# Patient Record
Sex: Female | Born: 1970 | Race: Black or African American | Hispanic: No | Marital: Married | Smoking: Former smoker
Health system: Southern US, Community
[De-identification: ages and names within clinical notes are randomized; demographics above are authoritative.]

## PROBLEM LIST (undated history)

## (undated) DIAGNOSIS — D869 Sarcoidosis, unspecified: Secondary | ICD-10-CM

## (undated) HISTORY — PX: NASAL SINUS SURGERY: SHX719

---

## 2003-07-14 DIAGNOSIS — D869 Sarcoidosis, unspecified: Secondary | ICD-10-CM

## 2003-07-14 HISTORY — DX: Sarcoidosis, unspecified: D86.9

## 2005-03-13 ENCOUNTER — Emergency Department: Payer: Self-pay | Admitting: Emergency Medicine

## 2005-07-20 ENCOUNTER — Emergency Department: Payer: Self-pay | Admitting: Unknown Physician Specialty

## 2005-08-16 ENCOUNTER — Emergency Department: Payer: Self-pay | Admitting: Internal Medicine

## 2006-08-10 ENCOUNTER — Emergency Department: Payer: Self-pay | Admitting: Emergency Medicine

## 2006-09-03 ENCOUNTER — Emergency Department: Payer: Self-pay | Admitting: Emergency Medicine

## 2006-11-28 ENCOUNTER — Emergency Department: Payer: Self-pay | Admitting: Emergency Medicine

## 2007-01-06 ENCOUNTER — Ambulatory Visit: Payer: Self-pay | Admitting: Physician Assistant

## 2007-01-26 ENCOUNTER — Ambulatory Visit: Payer: Self-pay | Admitting: Pain Medicine

## 2007-02-03 ENCOUNTER — Ambulatory Visit: Payer: Self-pay | Admitting: Pain Medicine

## 2007-02-23 ENCOUNTER — Ambulatory Visit: Payer: Self-pay | Admitting: Physician Assistant

## 2007-03-17 ENCOUNTER — Ambulatory Visit: Payer: Self-pay | Admitting: Pain Medicine

## 2007-04-07 ENCOUNTER — Ambulatory Visit: Payer: Self-pay | Admitting: Pain Medicine

## 2007-09-28 ENCOUNTER — Emergency Department: Payer: Self-pay | Admitting: Emergency Medicine

## 2007-09-28 ENCOUNTER — Inpatient Hospital Stay (HOSPITAL_COMMUNITY): Admission: EM | Admit: 2007-09-28 | Discharge: 2007-09-29 | Payer: Self-pay | Admitting: Emergency Medicine

## 2007-10-06 ENCOUNTER — Encounter: Admission: RE | Admit: 2007-10-06 | Discharge: 2007-10-06 | Payer: Self-pay | Admitting: Neurosurgery

## 2007-10-27 ENCOUNTER — Ambulatory Visit: Payer: Self-pay | Admitting: Neurosurgery

## 2007-11-14 ENCOUNTER — Ambulatory Visit: Payer: Self-pay | Admitting: Neurosurgery

## 2007-12-27 ENCOUNTER — Ambulatory Visit: Payer: Self-pay | Admitting: Neurosurgery

## 2008-02-18 ENCOUNTER — Emergency Department: Payer: Self-pay | Admitting: Emergency Medicine

## 2009-04-16 ENCOUNTER — Emergency Department: Payer: Self-pay | Admitting: Internal Medicine

## 2009-11-11 ENCOUNTER — Emergency Department: Payer: Self-pay | Admitting: Emergency Medicine

## 2010-05-30 ENCOUNTER — Emergency Department: Payer: Self-pay | Admitting: Unknown Physician Specialty

## 2010-07-27 ENCOUNTER — Emergency Department: Payer: Self-pay | Admitting: Internal Medicine

## 2010-11-01 ENCOUNTER — Emergency Department: Payer: Self-pay | Admitting: Emergency Medicine
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
## 2011-03-28 ENCOUNTER — Emergency Department: Payer: Self-pay | Admitting: Emergency Medicine

## 2011-04-06 LAB — APTT: aPTT: 27

## 2011-04-06 LAB — CBC
MCHC: 34.1
RBC: 3.69 — ABNORMAL LOW
RDW: 13.2

## 2011-04-06 LAB — DIFFERENTIAL
Basophils Absolute: 0
Basophils Relative: 1
Neutro Abs: 7.5
Neutrophils Relative %: 83 — ABNORMAL HIGH

## 2011-04-06 LAB — BASIC METABOLIC PANEL
CO2: 26
Calcium: 8.5
Creatinine, Ser: 0.49
GFR calc Af Amer: >60
GFR calc non Af Amer: >60

## 2011-04-06 LAB — PROTIME-INR: INR: 1

## 2011-10-31 ENCOUNTER — Emergency Department: Payer: Self-pay | Admitting: Emergency Medicine

## 2011-10-31 LAB — CBC
MCH: 26.1 pg (ref 26.0–34.0)
WBC: 4.5 10*3/uL (ref 3.6–11.0)

## 2011-10-31 LAB — TROPONIN I
Troponin-I: <0.02 ng/mL
Troponin-I: <0.02 ng/mL

## 2011-10-31 LAB — BASIC METABOLIC PANEL
Anion Gap: 7 (ref 7–16)
BUN: 15 mg/dL (ref 7–18)
Chloride: 107 mmol/L (ref 98–107)
EGFR (African American): >60
EGFR (Non-African Amer.): >60

## 2011-10-31 LAB — PRO B NATRIURETIC PEPTIDE: B-Type Natriuretic Peptide: 112 pg/mL (ref 0–125)

## 2011-10-31 LAB — CK TOTAL AND CKMB (NOT AT ARMC): CK-MB: 1 ng/mL (ref 0.5–3.6)

## 2011-11-20 ENCOUNTER — Ambulatory Visit: Payer: Self-pay | Admitting: Internal Medicine

## 2011-11-20 LAB — HCG, QUANTITATIVE, PREGNANCY: Beta Hcg, Quant.: <1 m[IU]/mL — ABNORMAL LOW

## 2014-12-17 ENCOUNTER — Ambulatory Visit
Admission: EM | Admit: 2014-12-17 | Discharge: 2014-12-17 | Disposition: A | Discharge status: Home / Self Care | Payer: BLUE CROSS/BLUE SHIELD | Attending: Family Medicine | Admitting: Family Medicine

## 2014-12-17 DIAGNOSIS — J0100 Acute maxillary sinusitis, unspecified: Secondary | ICD-10-CM

## 2014-12-17 HISTORY — DX: Sarcoidosis, unspecified: D86.9

## 2014-12-17 MED ORDER — AMOXICILLIN-POT CLAVULANATE 875-125 MG PO TABS: 1.0000 | ORAL_TABLET | Freq: Two times a day (BID) | ORAL | Status: DC

## 2014-12-17 NOTE — ED Notes (Signed)
Pt states "ear pain, sinus pressure and fevers started Saturday. I always have allergy and sinus problems when the weather changes."

## 2014-12-17 NOTE — ED Provider Notes (Signed)
CSN: [id number]     Arrival date & time 12/17/14  1734 History   First MD Initiated Contact with Patient 12/17/14 1845     Chief Complaint  Patient presents with  . Facial Pain  . Otalgia  . Fever   (Consider location/radiation/quality/duration/timing/severity/associated sxs/prior Treatment) Patient is a 44 y.o. female presenting with ear pain and fever. The history is provided by the patient.  Otalgia Location:  Left Severity:  Mild Onset quality:  Sudden Ineffective treatments:  OTC medications Associated symptoms: congestion, cough, fever, rhinorrhea and sore throat   Associated symptoms: no diarrhea, no ear discharge and no vomiting   Associated symptoms comment:  Sinus pressure and sinus headaches Fever Associated symptoms: congestion, cough, ear pain, rhinorrhea and sore throat   Associated symptoms: no diarrhea and no vomiting     Past Medical History  Diagnosis Date  . Hypertension   . Sarcoidosis 2005   Past Surgical History  Procedure Laterality Date  . Nasal sinus surgery     No family history on file. History  Substance Use Topics  . Smoking status: Current Every Day Smoker    Types: Cigarettes  . Smokeless tobacco: Not on file  . Alcohol Use: No   OB History    No data available     Review of Systems  Constitutional: Positive for fever.  HENT: Positive for congestion, ear pain, rhinorrhea and sore throat. Negative for ear discharge.   Respiratory: Positive for cough.   Gastrointestinal: Negative for vomiting and diarrhea.    Allergies  Review of patient's allergies indicates no known allergies.  Home Medications   Prior to Admission medications   Medication Sig Start Date End Date Taking? Authorizing Provider  cetirizine (ZYRTEC) 10 MG tablet Take 10 mg by mouth daily.   Yes Historical Provider, MD  fluticasone (FLONASE) 50 MCG/ACT nasal spray Place into both nostrils daily.   Yes Historical Provider, MD  ibuprofen (ADVIL,MOTRIN) 200 MG tablet  Take 800 mg by mouth every 8 (eight) hours as needed.   Yes Historical Provider, MD  amoxicillin-clavulanate (AUGMENTIN) 875-125 MG per tablet Take 1 tablet by mouth 2 (two) times daily. 12/17/14   Amy Mccallumrlando Kyndal Gloster, MD   BP 127/88 mmHg  Pulse 81  Temp(Src) 99 F (37.2 C) (Tympanic)  Resp 16  Ht 5\' 1"  (1.549 m)  Wt 160 lb (72.576 kg)  BMI 30.25 kg/m2  SpO2 100%  LMP 12/08/2014 (Exact Date) Physical Exam  Constitutional: She appears well-developed and well-nourished. No distress.  HENT:  Head: Normocephalic and atraumatic.  Right Ear: Tympanic membrane, external ear and ear canal normal.  Left Ear: Tympanic membrane, external ear and ear canal normal.  Nose: Mucosal edema and rhinorrhea present. No nose lacerations, sinus tenderness, nasal deformity, septal deviation or nasal septal hematoma. No epistaxis.  No foreign bodies. Right sinus exhibits maxillary sinus tenderness and frontal sinus tenderness. Left sinus exhibits maxillary sinus tenderness and frontal sinus tenderness.  Mouth/Throat: Uvula is midline, oropharynx is clear and moist and mucous membranes are normal. No oropharyngeal exudate.  Eyes: Conjunctivae and EOM are normal. Pupils are equal, round, and reactive to light. Right eye exhibits no discharge. Left eye exhibits no discharge. No scleral icterus.  Neck: Normal range of motion. Neck supple. No thyromegaly present.  Cardiovascular: Normal rate, regular rhythm and normal heart sounds.   Pulmonary/Chest: Effort normal and breath sounds normal. No respiratory distress. She has no wheezes. She has no rales.  Lymphadenopathy:    She has no cervical adenopathy.  Neurological: She is alert.  Skin: No rash noted. She is not diaphoretic.  Nursing note and vitals reviewed.   ED Course  Procedures (including critical care time) Labs Review Labs Reviewed - No data to display  Imaging Review No results found.   MDM   1. Acute maxillary sinusitis, recurrence not specified     Discharge Medication List as of 12/17/2014  6:55 PM    START taking these medications   Details  amoxicillin-clavulanate (AUGMENTIN) 875-125 MG per tablet Take 1 tablet by mouth 2 (two) times daily., Starting 12/17/2014, Until Discontinued, Normal      Plan: 1. Diagnosis reviewed with patient 2. rx as per orders; risks, benefits, potential side effects reviewed with patient 3. Recommend supportive treatment with otc analgesics, rest, increased fluids 4. F/u prn if symptoms worsen or don't improve    Amy Mccallum, MD 12/17/14 [phone]

## 2016-08-05 ENCOUNTER — Encounter: Payer: Self-pay | Admitting: Emergency Medicine

## 2016-08-05 ENCOUNTER — Emergency Department
Admission: EM | Admit: 2016-08-05 | Discharge: 2016-08-05 | Disposition: A | Discharge status: Home / Self Care | Payer: BLUE CROSS/BLUE SHIELD | Attending: Emergency Medicine | Admitting: Emergency Medicine

## 2016-08-05 DIAGNOSIS — R509 Fever, unspecified: Secondary | ICD-10-CM | POA: Insufficient documentation

## 2016-08-05 DIAGNOSIS — Z79899 Other long term (current) drug therapy: Secondary | ICD-10-CM | POA: Insufficient documentation

## 2016-08-05 DIAGNOSIS — R51 Headache: Secondary | ICD-10-CM | POA: Insufficient documentation

## 2016-08-05 DIAGNOSIS — R05 Cough: Secondary | ICD-10-CM | POA: Insufficient documentation

## 2016-08-05 DIAGNOSIS — J111 Influenza due to unidentified influenza virus with other respiratory manifestations: Secondary | ICD-10-CM

## 2016-08-05 DIAGNOSIS — R69 Illness, unspecified: Secondary | ICD-10-CM

## 2016-08-05 DIAGNOSIS — R5383 Other fatigue: Secondary | ICD-10-CM | POA: Insufficient documentation

## 2016-08-05 DIAGNOSIS — I10 Essential (primary) hypertension: Secondary | ICD-10-CM | POA: Insufficient documentation

## 2016-08-05 DIAGNOSIS — F17210 Nicotine dependence, cigarettes, uncomplicated: Secondary | ICD-10-CM | POA: Insufficient documentation

## 2016-08-05 LAB — INFLUENZA PANEL BY PCR (TYPE A & B)
Influenza A By PCR: POSITIVE — AB
Influenza B By PCR: NEGATIVE

## 2016-08-05 MED ORDER — ACETAMINOPHEN 325 MG PO TABS
ORAL_TABLET | ORAL | Status: AC
  Filled 2016-08-05: qty 2

## 2016-08-05 MED ORDER — ACETAMINOPHEN 325 MG PO TABS
650.0000 mg | ORAL_TABLET | Freq: Once | ORAL | Status: AC | PRN
  Administered 2016-08-05: 650 mg via ORAL

## 2016-08-05 MED ORDER — ACETAMINOPHEN 500 MG PO TABS
1000.0000 mg | ORAL_TABLET | Freq: Once | ORAL | Status: DC
Start: 2016-08-05 — End: 2016-08-05
  Filled 2016-08-05: qty 2

## 2016-08-05 MED ORDER — OSELTAMIVIR PHOSPHATE 75 MG PO CAPS: 75.0000 mg | ORAL_CAPSULE | Freq: Two times a day (BID) | ORAL | 0 refills | Status: AC

## 2016-08-05 NOTE — ED Provider Notes (Signed)
[facility] Emergency Department Provider Note  ____________________________________________  Time seen: Approximately 5:44 PM  I have reviewed the triage vital signs and the nursing notes.   HISTORY  Chief Complaint Cough; Fever; and Generalized Body Aches    HPI Amy [name] is a 46 y.o. female presenting to the emergency department with headache, congestion, rhinorrhea, fatigue, myalgias and nonproductive cough that started yesterday. Patient has been febrile. Fever has been as high as 102.28F assessed orally. 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 She denies chest pain, chest tightness, shortness of breath, cough that disturbs sleep, diarrhea and vomiting. Patient is staying hydrated. She has experienced diminished appetite and increased sleep. She has tried Tylenol but has attempted no other alleviating measures. No recent travel.   Past Medical History:  Diagnosis Date  . Hypertension   . Sarcoidosis (HCC) 2005    There are no active problems to display for this patient.   Past Surgical History:  Procedure Laterality Date  . NASAL SINUS SURGERY      Prior to Admission medications   Medication Sig Start Date End Date Taking? Authorizing Provider  amoxicillin-clavulanate (AUGMENTIN) 875-125 MG per tablet Take 1 tablet by mouth 2 (two) times daily. 12/17/14   Payton Mccallumrlando Conty, MD  cetirizine (ZYRTEC) 10 MG tablet Take 10 mg by mouth daily.    Historical Provider, MD  fluticasone (FLONASE) 50 MCG/ACT nasal spray Place into both nostrils daily.    Historical Provider, MD  ibuprofen (ADVIL,MOTRIN) 200 MG tablet Take 800 mg by mouth every 8 (eight) hours as needed.    Historical Provider, MD  oseltamivir (TAMIFLU) 75 MG capsule Take 1 capsule (75 mg total) by mouth 2 (two) times daily. 08/05/16 08/10/16  Orvil FeilJaclyn M Woods, PA-C    Allergies Patient has no known allergies.  No family history on file.  Social History Social History   Substance Use Topics  . Smoking status: Current Every Day Smoker    Types: Cigarettes  . Smokeless tobacco: Never Used  . Alcohol use No    Review of Systems  Constitutional: Patient has had fever.  Eyes: No visual changes. No discharge ENT: Patient has had congestion.  Cardiovascular: no chest pain. Respiratory: Patient has had non-productive cough.  No SOB. Gastrointestinal: Patient has had nausea.  Genitourinary: Negative for dysuria. No hematuria Musculoskeletal: Patient has had myalgias. Skin: Negative for rash, abrasions, lacerations, ecchymosis. Neurological: Negative for headaches, focal weakness or numbness.  ____________________________________________   PHYSICAL EXAM:  VITAL SIGNS: ED Triage Vitals  Enc Vitals Group     BP 08/05/16 1659 120/70     Pulse Rate 08/05/16 1659 88     Resp 08/05/16 1659 18     Temp 08/05/16 1659 (!) 102.3 F (39.1 C)     Temp Source 08/05/16 1659 Oral     SpO2 08/05/16 1659 100 %     Weight 08/05/16 1700 165 lb (74.8 kg)     Height 08/05/16 1700 5\' 1"  (1.549 m)     Head Circumference --      Peak Flow --      Pain Score 08/05/16 1703 10     Pain Loc --      Pain Edu? --      Excl. in GC? --      Constitutional: Alert and oriented. Patient is lying supine in bed.  Eyes: Conjunctivae are normal. PERRL. EOMI. Head: Atraumatic. ENT:      Ears: Tympanic membranes are injected  bilaterally without evidence of effusion or purulent exudate. Bony landmarks are visualized bilaterally. No pain with palpation at the tragus.      Nose: Nasal turbinates are edematous and erythematous. Copious rhinorrhea visualized.      Mouth/Throat: Mucous membranes are moist. Posterior pharynx is mildly erythematous. No tonsillar hypertrophy or purulent exudate. Uvula is midline. Neck: Full range of motion. No pain is elicited with flexion at the neck. Hematological/Lymphatic/Immunilogical: No cervical lymphadenopathy. Cardiovascular: Normal rate,  regular rhythm. Normal S1 and S2.  Good peripheral circulation. Respiratory: Normal respiratory effort without tachypnea or retractions. Lungs CTAB. Good air entry to the bases with no decreased or absent breath sounds. Gastrointestinal: Bowel sounds 4 quadrants. Soft and nontender to palpation. No guarding or rigidity. No palpable masses. No distention. No CVA tenderness.  Skin:  Skin is warm, dry and intact. No rash noted. Psychiatric: Mood and affect are normal. Speech and behavior are normal. Patient exhibits appropriate insight and judgement.  ____________________________________________   LABS (all labs ordered are listed, but only abnormal results are displayed)  Labs Reviewed  INFLUENZA PANEL BY PCR (TYPE A & B)   ____________________________________________  EKG   ____________________________________________  RADIOLOGY   No results found.  ____________________________________________    PROCEDURES  Procedure(s) performed:    Procedures    Medications  acetaminophen (TYLENOL) tablet 1,000 mg (not administered)  acetaminophen (TYLENOL) tablet 650 mg (650 mg Oral Given 08/05/16 1706)     ____________________________________________   INITIAL IMPRESSION / ASSESSMENT AND PLAN / ED COURSE  Pertinent labs & imaging results that were available during my care of the patient were reviewed by me and considered in my medical decision making (see chart for details).  Review of the [place] CSRS was performed in accordance of the [facility] prior to dispensing any controlled drugs.    Assessment and plan: Influenza:  Patient presents to the emergency department with headache, congestion, rhinorrhea, fatigue, myalgias and nonproductive cough that started yesterday. Symptoms are consistent with influenza. Patient was given Tylenol in emergency department for fever. She was discharged with Tamiflu. Rest and hydration were encouraged. Patient was advised to follow-up with her  primary care provider in one week. Physical exam is reassuring at this time. All patient questions were answered.  ____________________________________________  FINAL CLINICAL IMPRESSION(S) / ED DIAGNOSES  Final diagnoses:  Influenza-like illness      NEW MEDICATIONS STARTED DURING THIS VISIT:  New Prescriptions   OSELTAMIVIR (TAMIFLU) 75 MG CAPSULE    Take 1 capsule (75 mg total) by mouth 2 (two) times daily.        This chart was dictated using voice recognition software/Dragon. Despite best efforts to proofread, errors can occur which can change the meaning. Any change was purely unintentional.    Orvil Feil, PA-C 08/05/16 1754    Rockne Menghini, MD 08/05/16 2355

## 2016-08-05 NOTE — ED Triage Notes (Signed)
Pt comes into the ED via POV c/o flu-like symptoms with cough, fever, body aches.  Patient states she feels as though there is a severe pressure behind her ears and constant headaches.  Patient in NAD at this time with even and unlabored respirations.  Patient able to ambulate with no difficulty to triage room.

## 2016-08-07 ENCOUNTER — Telehealth: Payer: Self-pay | Admitting: Emergency Medicine

## 2016-08-07 NOTE — Telephone Encounter (Signed)
Called patient to inform of flu result.  Says she is feeling some better.  Has a headache still.  I told her that could be from flu, but if she thought the headache from something else like a stroke or meningitis, she should return.  She is taking the tamiflu.

## 2018-02-26 ENCOUNTER — Encounter: Payer: Self-pay | Admitting: Emergency Medicine

## 2018-02-26 ENCOUNTER — Emergency Department
Admission: EM | Admit: 2018-02-26 | Discharge: 2018-02-26 | Disposition: A | Discharge status: Home / Self Care | Payer: BLUE CROSS/BLUE SHIELD | Attending: Student in an Organized Health Care Education/Training Program | Admitting: Student in an Organized Health Care Education/Training Program

## 2018-02-26 DIAGNOSIS — Z87891 Personal history of nicotine dependence: Secondary | ICD-10-CM | POA: Insufficient documentation

## 2018-02-26 DIAGNOSIS — M5441 Lumbago with sciatica, right side: Secondary | ICD-10-CM | POA: Insufficient documentation

## 2018-02-26 DIAGNOSIS — Z79899 Other long term (current) drug therapy: Secondary | ICD-10-CM | POA: Insufficient documentation

## 2018-02-26 MED ORDER — KETOROLAC TROMETHAMINE 30 MG/ML IJ SOLN
30.0000 mg | Freq: Once | INTRAMUSCULAR | Status: AC
  Administered 2018-02-26: 30 mg via INTRAMUSCULAR
  Filled 2018-02-26: qty 1

## 2018-02-26 MED ORDER — PREDNISONE 10 MG PO TABS: ORAL_TABLET | ORAL | 0 refills | Status: AC

## 2018-02-26 NOTE — ED Notes (Signed)
Pt states she applies heating pads when pain is present and takes 4 ibuprofen (200mg ) per day. Per states" when the pain is present, which is usually everyday I take  4 ibuprofen (200mg ). Pt states, she noticed  R/leg swelling and left ankle swelling.

## 2018-02-26 NOTE — ED Notes (Signed)

## 2018-02-26 NOTE — ED Provider Notes (Signed)
[facility] Emergency Department Provider Note  ____________________________________________   First MD Initiated Contact with Patient 02/26/18 1018     (approximate)  I have reviewed the triage vital signs and the nursing notes.   HISTORY  Chief Complaint Back Pain   HPI Lonie PeakCathy A Marchio is a 47 y.o. female presents to the emergency department with complaint of intermittent low back pain radiating over to the right.  Patient states she does have radiation into her right hip and sometimes all the way down to her toes.  She denies any incontinence of bowel or bladder or saddle anesthesias.  Patient currently is not taking any over-the-counter medication for her back pain.  Patient denies any UTI symptoms or history of kidney stone.  Currently she rates her pain as 7 out of 10.   Past Medical History:  Diagnosis Date  . Sarcoidosis 2005    There are no active problems to display for this patient.   Past Surgical History:  Procedure Laterality Date  . NASAL SINUS SURGERY      Prior to Admission medications   Medication Sig Start Date End Date Taking? Authorizing Provider  cetirizine (ZYRTEC) 10 MG tablet Take 10 mg by mouth daily.    [provider]  fluticasone (FLONASE) 50 MCG/ACT nasal spray Place into both nostrils daily.    [provider]  ibuprofen (ADVIL,MOTRIN) 200 MG tablet Take 800 mg by mouth every 8 (eight) hours as needed.    [provider]  predniSONE (DELTASONE) 10 MG tablet Take 6 tablets  today, on day 2 take 5 tablets, day 3 take 4 tablets, day 4 take 3 tablets, day 5 take  2 tablets and 1 tablet the last day 02/26/18   Tommi RumpsSummers, Rhonda L, PA-C    Allergies Patient has no known allergies.  No family history on file.  Social History Social History   Tobacco Use  . Smoking status: Former Smoker    Types: Cigarettes, E-cigarettes  . Smokeless tobacco: Never Used  . Tobacco comment: Patient vapes    Substance Use Topics  . Alcohol use: No  . Drug use: No    Review of Systems Constitutional: No fever/chills Cardiovascular: Denies chest pain. Respiratory: Denies shortness of breath. Genitourinary: Negative for dysuria. Musculoskeletal: Positive for low back pain with right leg radiculopathy. Skin: Negative for rash. Neurological: Negative for headaches, focal weakness. ___________________________________________   PHYSICAL EXAM:  VITAL SIGNS: ED Triage Vitals  Enc Vitals Group     BP 02/26/18 1001 (!) 142/80     Pulse Rate 02/26/18 1001 69     Resp 02/26/18 1001 18     Temp 02/26/18 1001 98.7 F (37.1 C)     Temp Source 02/26/18 1001 Oral     SpO2 02/26/18 1001 100 %     Weight 02/26/18 0958 165 lb (74.8 kg)     Height 02/26/18 0958 5\' 1"  (1.549 m)     Head Circumference --      Peak Flow --      Pain Score 02/26/18 0958 7     Pain Loc --      Pain Edu? --      Excl. in GC? --    Constitutional: Alert and oriented. Well appearing and in no acute distress. Eyes: Conjunctivae are normal.  Head: Atraumatic. Neck: No stridor.   Cardiovascular: Normal rate, regular rhythm. Grossly normal heart sounds.  Good peripheral circulation. Respiratory: Normal respiratory effort.  No retractions. Lungs CTAB.  Gastrointestinal: Soft and nontender. No distention.  No CVA tenderness. Musculoskeletal: On examination of the back there is no gross deformity noted however there is tenderness on palpation of the L5-S1 area and right SI joint and soft tissue surrounding that area.  Range of motion is slow and guarded secondary to discomfort.  Straight leg raises are negative.  Good muscle strength bilaterally.  Patient is able to ambulate without any assistance. Neurologic:  Normal speech and language. No gross focal neurologic deficits are appreciated.  Reflexes 2+ bilaterally infra patella tendon.  No gait instability. Skin:  Skin is warm, dry and intact.  No rashes present. Psychiatric:  Mood and affect are normal. Speech and behavior are normal.  ____________________________________________   LABS (all labs ordered are listed, but only abnormal results are displayed)  Labs Reviewed - No data to display  PROCEDURES  Procedure(s) performed: None  Procedures  Critical Care performed: No  ____________________________________________   INITIAL IMPRESSION / ASSESSMENT AND PLAN / ED COURSE  As part of my medical decision making, I reviewed the following data within the electronic MEDICAL RECORD NUMBER Notes from prior ED visits and [place] Controlled Substance Database  Patient presents with low back pain with symptoms of right lower leg radiculopathy.  Patient denies any recent injury.  She is currently not taking any over-the-counter medication.  Patient was given Toradol 30 mg IM while in the department.  Patient was noted to be ambulatory without any assistance.  Patient is to start redness and 60 mg taper over the next 6 days.  She is to follow-up with her PCP or [facility] if any continued problems.  She was told she could take Tylenol if needed with the prednisone and encouraged to use ice or heat to her back. ____________________________________________   FINAL CLINICAL IMPRESSION(S) / ED DIAGNOSES  Final diagnoses:  Acute right-sided low back pain with right-sided sciatica     ED Discharge Orders         Ordered    predniSONE (DELTASONE) 10 MG tablet     02/26/18 1037           Note:  This document was prepared using Dragon voice recognition software and may include unintentional dictation errors.    Tommi RumpsSummers, Rhonda L, PA-C 02/26/18 1455    Willy Eddyobinson, Patrick, MD 02/26/18 [phone]

## 2018-02-26 NOTE — Discharge Instructions (Addendum)
Call make an appointment with 1 the clinics listed on your discharge papers or Doctor of your choice to establish primary care.  Begin taking prednisone for the next 6 days as directed.  You are also given an injection of Toradol 30 mg IM.  You may also use ice or heat to your back as needed for discomfort.

## 2018-02-26 NOTE — ED Triage Notes (Signed)
Patient presents to the ED with right lower back pain that has been intermittent, "for awhile."  Patient reports history of degenerative disc disease.  Patient states pain radiates down her right hip into her leg and foot.

## 2018-04-11 ENCOUNTER — Emergency Department
Admission: EM | Admit: 2018-04-11 | Discharge: 2018-04-11 | Disposition: A | Discharge status: Home / Self Care | Payer: Self-pay | Attending: Emergency Medicine | Admitting: Emergency Medicine

## 2018-04-11 ENCOUNTER — Other Ambulatory Visit: Payer: Self-pay

## 2018-04-11 ENCOUNTER — Encounter: Payer: Self-pay | Admitting: Emergency Medicine

## 2018-04-11 DIAGNOSIS — R51 Headache: Secondary | ICD-10-CM | POA: Insufficient documentation

## 2018-04-11 DIAGNOSIS — Z5321 Procedure and treatment not carried out due to patient leaving prior to being seen by health care provider: Secondary | ICD-10-CM | POA: Insufficient documentation

## 2018-04-11 NOTE — ED Notes (Signed)
Patient advised she was leaving and would come back later.

## 2018-04-11 NOTE — ED Notes (Signed)
See triage note  Presents with headache since Friday  States she has a hx of migraines but this headache was different  Noticed that her b/p was slightly elevated   Rested over the weekend which eased off the headache  But yesterday and today states that her b/p was slightly elevated again

## 2018-04-11 NOTE — ED Triage Notes (Signed)
Headache L sided x 3 days. History of migraines. No fevers. No falls or head injury. No nuchal rigidity.

## 2021-03-21 ENCOUNTER — Other Ambulatory Visit: Payer: Self-pay | Admitting: Family Medicine

## 2021-03-21 DIAGNOSIS — Z1231 Encounter for screening mammogram for malignant neoplasm of breast: Secondary | ICD-10-CM

## 2021-08-29 ENCOUNTER — Other Ambulatory Visit: Payer: Self-pay

## 2021-08-29 ENCOUNTER — Ambulatory Visit
Admission: RE | Admit: 2021-08-29 | Discharge: 2021-08-29 | Disposition: A | Discharge status: Home / Self Care | Payer: BLUE CROSS/BLUE SHIELD | Source: Ambulatory Visit | Attending: Family Medicine | Admitting: Family Medicine

## 2021-08-29 DIAGNOSIS — Z1231 Encounter for screening mammogram for malignant neoplasm of breast: Secondary | ICD-10-CM | POA: Diagnosis not present

## 2022-10-05 ENCOUNTER — Other Ambulatory Visit: Payer: Self-pay | Admitting: Family Medicine

## 2022-10-05 DIAGNOSIS — Z1231 Encounter for screening mammogram for malignant neoplasm of breast: Secondary | ICD-10-CM

## 2022-10-20 ENCOUNTER — Ambulatory Visit
Admission: RE | Admit: 2022-10-20 | Discharge: 2022-10-20 | Disposition: A | Discharge status: Home / Self Care | Payer: BLUE CROSS/BLUE SHIELD | Source: Ambulatory Visit | Attending: Family Medicine | Admitting: Family Medicine

## 2022-10-20 DIAGNOSIS — Z1231 Encounter for screening mammogram for malignant neoplasm of breast: Secondary | ICD-10-CM | POA: Diagnosis not present

## 2023-08-20 ENCOUNTER — Other Ambulatory Visit: Payer: Self-pay | Admitting: Family Medicine

## 2023-08-20 DIAGNOSIS — Z1231 Encounter for screening mammogram for malignant neoplasm of breast: Secondary | ICD-10-CM

## 2023-10-21 ENCOUNTER — Ambulatory Visit
Admission: RE | Admit: 2023-10-21 | Discharge: 2023-10-21 | Disposition: A | Discharge status: Home / Self Care | Payer: BLUE CROSS/BLUE SHIELD | Source: Ambulatory Visit | Attending: Family Medicine | Admitting: Family Medicine

## 2023-10-21 DIAGNOSIS — Z1231 Encounter for screening mammogram for malignant neoplasm of breast: Secondary | ICD-10-CM | POA: Insufficient documentation

## 2023-12-02 IMAGING — MG MM DIGITAL SCREENING BILAT W/ TOMO AND CAD
8 series · 8 of 24 positions shown · non-contrast
Comparison: Previous exam(s).

CLINICAL DATA: Screening.

EXAM:
DIGITAL SCREENING BILATERAL MAMMOGRAM WITH TOMOSYNTHESIS AND CAD
TECHNIQUE: Bilateral screening digital craniocaudal and mediolateral oblique
mammograms were obtained. Bilateral screening digital breast
tomosynthesis was performed. The images were evaluated with
computer-aided detection.

[R MLO synth-2D]
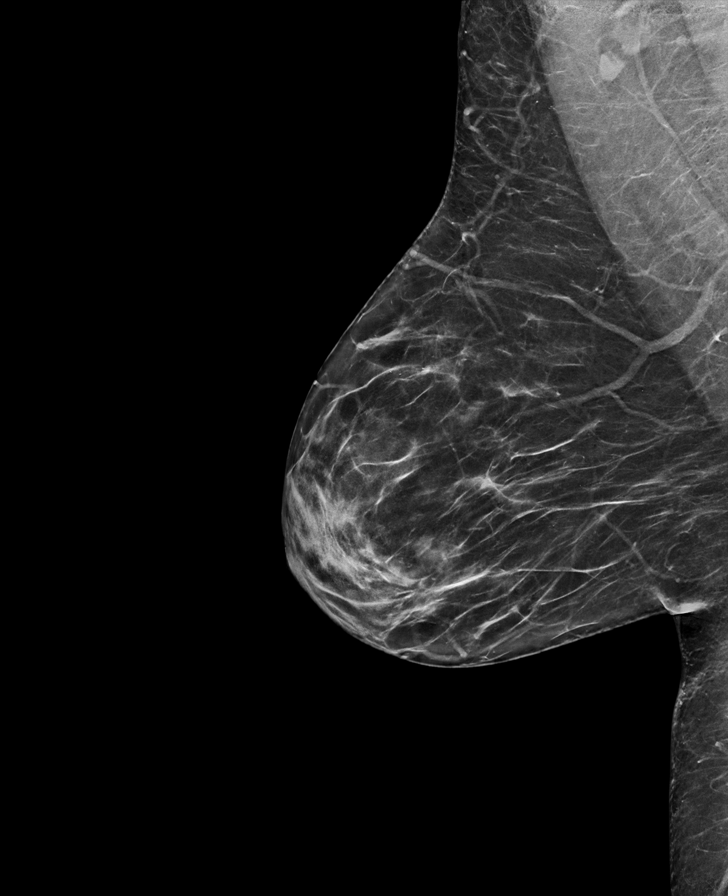

[L CC synth-2D]
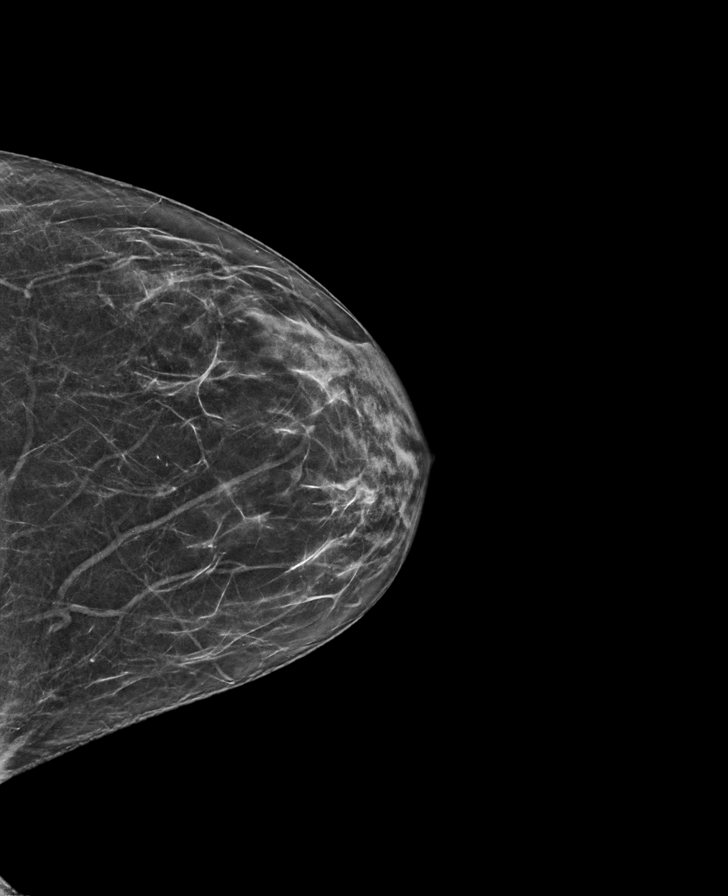

[L MLO synth-2D]
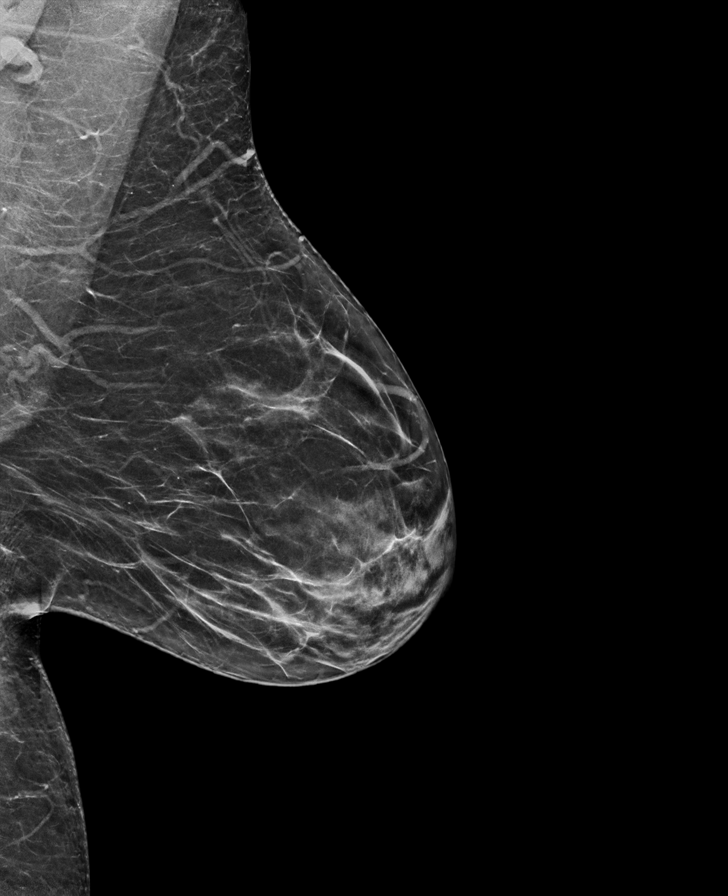

[R CC synth-2D]
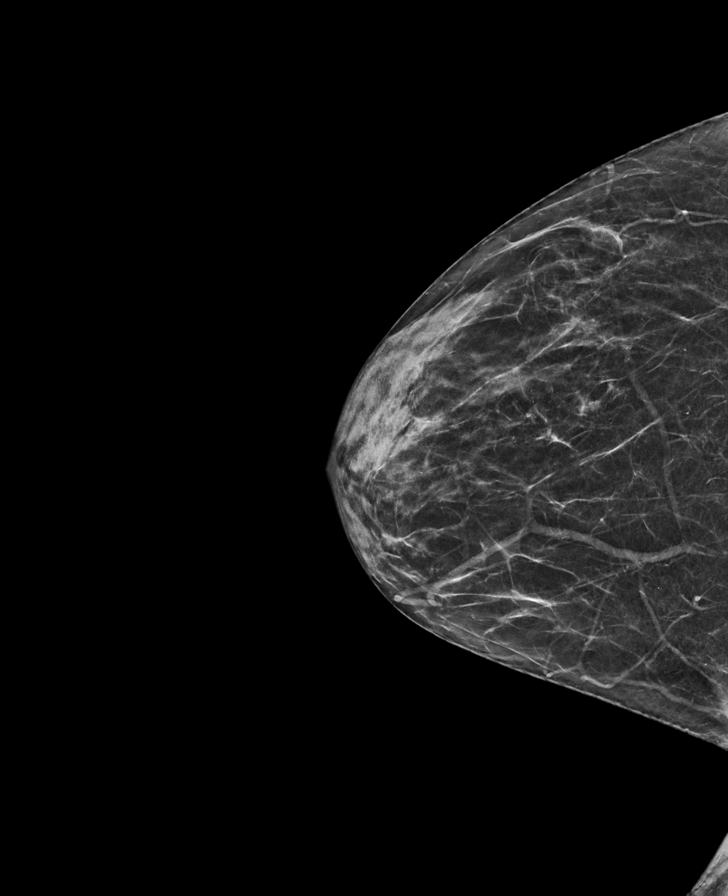

[L MLO tomo · tomo slice 33/66.0]
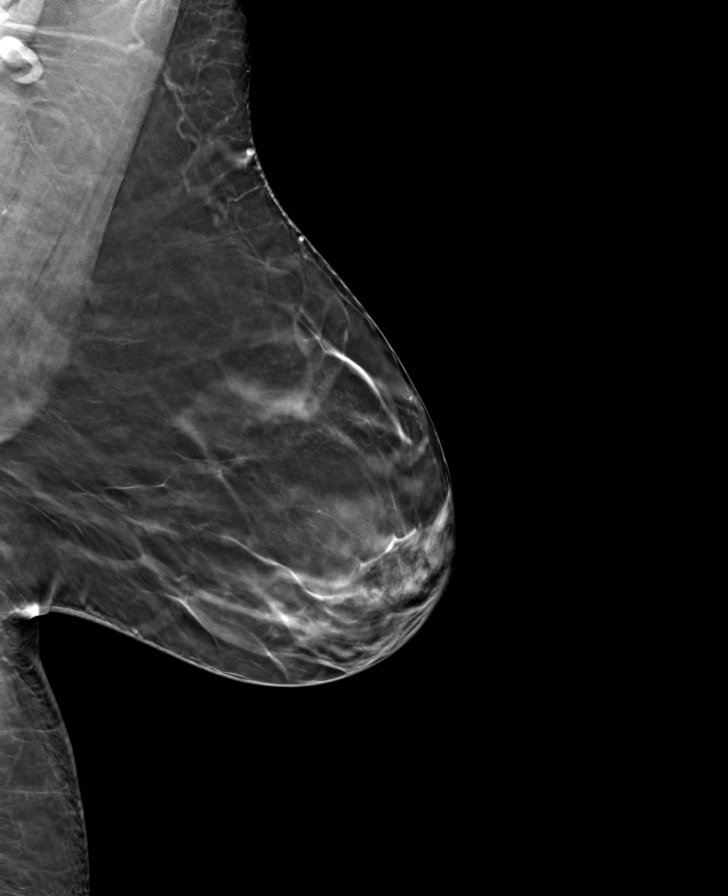

[R MLO tomo · tomo slice 33/66.0]
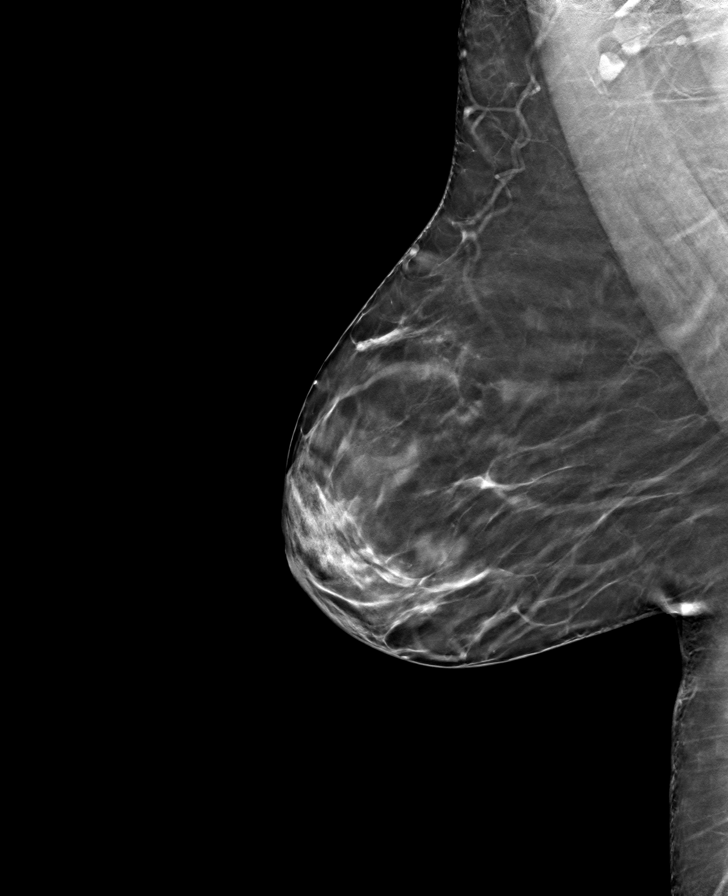

[L CC tomo · tomo slice 31/60.0]
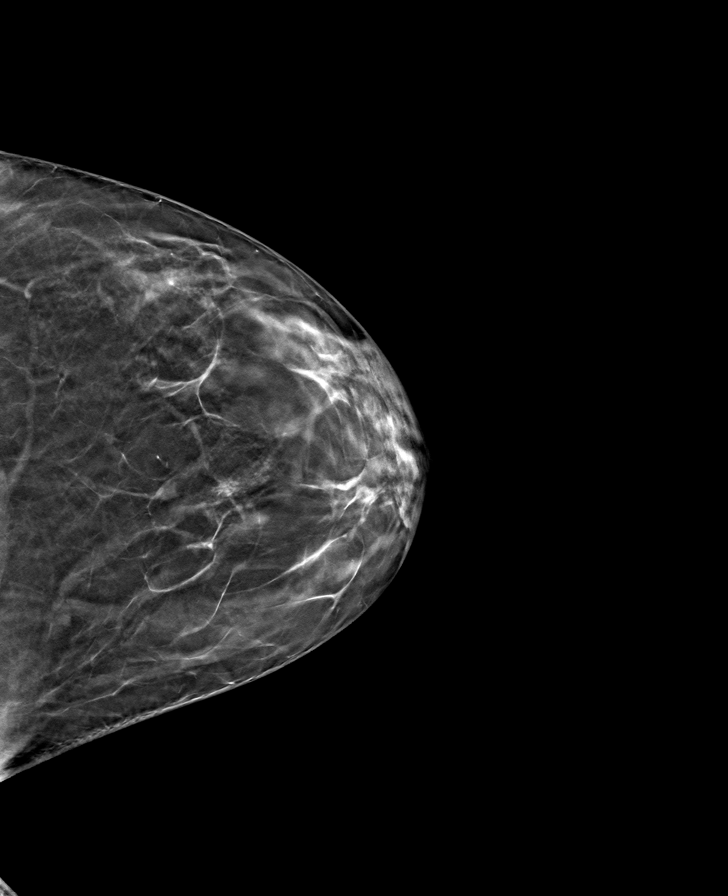

[R CC tomo · tomo slice 28/55.0]
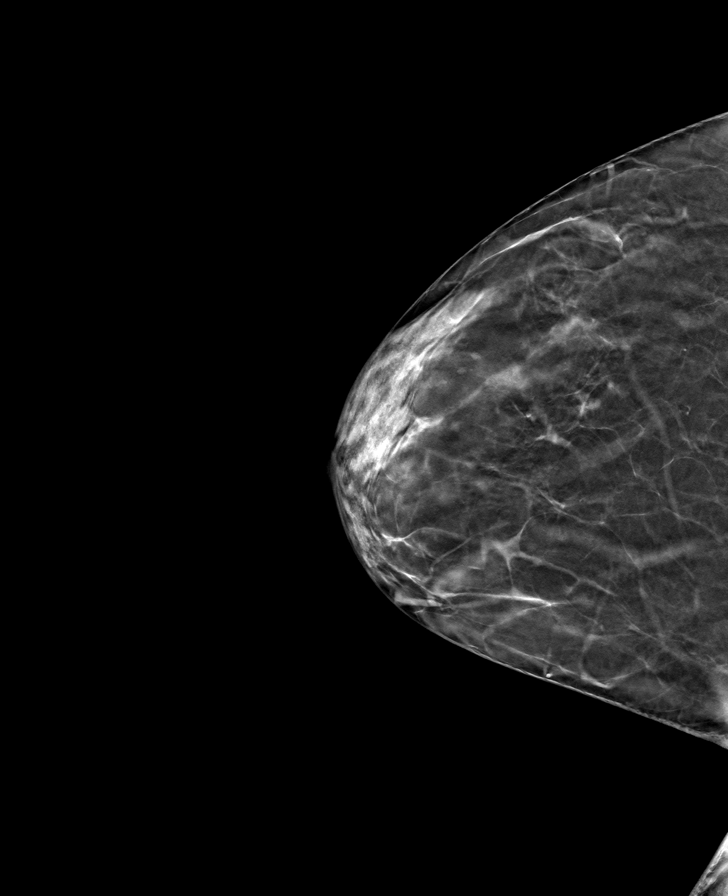

[8 of 24 positions shown; findings below may reference images not displayed]

ACR Breast Density Category b: There are scattered areas of
fibroglandular density.
FINDINGS: There are no findings suspicious for malignancy.
IMPRESSION: No mammographic evidence of malignancy. A result letter of this
screening mammogram will be mailed directly to the patient.

RECOMMENDATION:
Screening mammogram in one year. (Code:51-O-LD2)

BI-RADS CATEGORY  1: Negative.
